# Patient Record
Sex: Female | Born: 2000 | Race: Black or African American | Hispanic: No | Marital: Single | Smoking: Never smoker
Health system: Southern US, Community
[De-identification: ages and names within clinical notes are randomized; demographics above are authoritative.]

## PROBLEM LIST (undated history)

## (undated) DIAGNOSIS — F909 Attention-deficit hyperactivity disorder, unspecified type: Secondary | ICD-10-CM

## (undated) DIAGNOSIS — F988 Other specified behavioral and emotional disorders with onset usually occurring in childhood and adolescence: Secondary | ICD-10-CM

## (undated) DIAGNOSIS — J45909 Unspecified asthma, uncomplicated: Secondary | ICD-10-CM

## (undated) DIAGNOSIS — R7309 Other abnormal glucose: Secondary | ICD-10-CM

## (undated) HISTORY — DX: Attention-deficit hyperactivity disorder, unspecified type: F90.9

## (undated) HISTORY — PX: NO PAST SURGERIES: SHX2092

## (undated) HISTORY — DX: Other abnormal glucose: R73.09

---
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
## 2022-12-08 ENCOUNTER — Ambulatory Visit
Admission: EM | Admit: 2022-12-08 | Discharge: 2022-12-08 | Disposition: A | Discharge status: Home / Self Care | Payer: Medicaid Other | Attending: Physician Assistant | Admitting: Physician Assistant

## 2022-12-08 DIAGNOSIS — K209 Esophagitis, unspecified without bleeding: Secondary | ICD-10-CM

## 2022-12-08 DIAGNOSIS — R112 Nausea with vomiting, unspecified: Secondary | ICD-10-CM | POA: Diagnosis present

## 2022-12-08 DIAGNOSIS — K047 Periapical abscess without sinus: Secondary | ICD-10-CM | POA: Diagnosis present

## 2022-12-08 DIAGNOSIS — T39395A Adverse effect of other nonsteroidal anti-inflammatory drugs [NSAID], initial encounter: Secondary | ICD-10-CM

## 2022-12-08 DIAGNOSIS — N926 Irregular menstruation, unspecified: Secondary | ICD-10-CM

## 2022-12-08 DIAGNOSIS — R6884 Jaw pain: Secondary | ICD-10-CM | POA: Diagnosis present

## 2022-12-08 DIAGNOSIS — K2960 Other gastritis without bleeding: Secondary | ICD-10-CM

## 2022-12-08 LAB — PREGNANCY, URINE: Preg Test, Ur: NEGATIVE

## 2022-12-08 MED ORDER — ONDANSETRON 4 MG PO TBDP: 4.0000 mg | ORAL_TABLET | Freq: Three times a day (TID) | ORAL | 0 refills | Status: DC | PRN

## 2022-12-08 MED ORDER — PANTOPRAZOLE SODIUM 20 MG PO TBEC: 40.0000 mg | DELAYED_RELEASE_TABLET | Freq: Every day | ORAL | 0 refills | Status: DC

## 2022-12-08 MED ORDER — AMOXICILLIN-POT CLAVULANATE 875-125 MG PO TABS: 1.0000 | ORAL_TABLET | Freq: Two times a day (BID) | ORAL | 0 refills | Status: AC

## 2022-12-08 MED ORDER — HYDROCODONE-ACETAMINOPHEN 5-325 MG PO TABS: 1.0000 | ORAL_TABLET | Freq: Four times a day (QID) | ORAL | 0 refills | Status: AC | PRN

## 2022-12-08 NOTE — ED Triage Notes (Signed)
Pt is with her [employer]  Pt c/o lump along the neck x2weeks chest pain when laying down.  Pt has been taking 800mg  ibuprofen for dental pain and it has not been helping  Pt has been having pain in her chest when laying down or sitting. Pt states that it feels like she is being stabbed.   Pt recently had dental surgery on the lower left jaw.    Pt denies vaginal odor, discharge, or urinary issues.  Pt states that her menstraul is 78 days late and asks for a pregnancy test.

## 2022-12-08 NOTE — Discharge Instructions (Addendum)
-  You have a dental infection.  I sent an antibiotic to the pharmacy. - Keep your follow-up appoint with your dentist. - May apply Orajel to the tooth.  Take the pain medication as needed.  Avoid all NSAIDs as I think that is the cause of your chest pain.  I sent an antacid to the pharmacy to help with the chest discomfort.  Increase rest and fluids.  Soft and bland foods.  Small meals.  Do not eat 2 hours before bedtime. - You should follow-up with a gynecologist for your first Pap smear and for evaluation of irregular menstrual periods.

## 2022-12-08 NOTE — ED Provider Notes (Signed)
[facility]    CSN: [id number] Arrival date & time: 12/08/22  1215      History   Chief Complaint Chief Complaint  Patient presents with   Mass    HPI Jaclyn Phillips is a 22 y.o. female presenting for 1 month history of pain of a tooth of the left lower side.  Also reports pain into her jaw and swelling under her chin.  She has a history of dental problems and reports that she actually has an appointment with a dentist in 2 days to have the tooth pulled.  Has not seen a dentist in a long time.  She says her dental pain and jaw pain have been so bad that she is taking high doses of ibuprofen.  Her family reports that she is taking 3-4 ibuprofen every 2-4 hours for her pain.  Over the past 2 days she has developed pain in the center of her chest.  She has also had nausea and a little bit of vomiting.  No abdominal pain.  Denies associated fever but again has been taking high doses of ibuprofen.  No difficulty swallowing or breathing.  She says her last LMP was probably a couple months ago.  She is sexually active.  Reports a history of irregular menstrual periods for the past couple years but has never had it evaluated.  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 No pertinent surgical history.  OB History   No obstetric history on file.      Home Medications    Prior  to Admission medications   Medication Sig Start Date End Date Taking? Authorizing Provider  albuterol (PROVENTIL) (2.5 MG/3ML) 0.083% nebulizer solution Take 3 mLs (2.5 mg total) by nebulization every 4 (four) hours as needed for wheezing or shortness of breath. 01/03/14  Yes Niel Hummer, MD  amoxicillin-clavulanate (AUGMENTIN) 875-125 MG tablet Take 1 tablet by mouth every 12 (twelve) hours for 7 days. 12/08/22 12/15/22 Yes Shirlee Latch, PA-C  cetirizine (ZYRTEC) 10 MG tablet Take 10 mg by mouth daily. 09/24/16  Yes [provider]  FOCALIN XR 15 MG 24 hr capsule Take by mouth daily. 09/24/16  Yes [provider]  HYDROcodone-acetaminophen (NORCO) 5-325 MG tablet Take 1 tablet by mouth every 6 (six) hours as needed for up to 3 days for moderate pain (pain score 4-6). 12/08/22 12/11/22 Yes Shirlee Latch, PA-C  metFORMIN (GLUCOPHAGE) 500 MG tablet Take 1 tablet (500 mg total) by mouth daily with supper. 02/21/17  Yes Casimiro Needle, MD  norethindrone-ethinyl estradiol-iron (MICROGESTIN FE,GILDESS FE,LOESTRIN FE) 1.5-30 MG-MCG tablet Take 1 tablet by mouth daily. 02/21/17  Yes Casimiro Needle, MD  ondansetron (ZOFRAN-ODT) 4 MG disintegrating tablet Take 1 tablet (4 mg total) by mouth every 8 (eight) hours as needed. 12/08/22  Yes Eusebio Friendly B, PA-C  pantoprazole (PROTONIX) 20  MG tablet Take 2 tablets (40 mg total) by mouth daily for 14 days. 12/08/22 12/22/22 Yes Shirlee Latch, PA-C    Family History Family History  Adopted: Yes  Problem Relation Age of Onset   Healthy Sister     Social History Social History   Tobacco Use   Smoking status: Never   Smokeless tobacco: Never  Vaping Use   Vaping status: Every Day  Substance Use Topics   Alcohol use: Yes   Drug use: No     Allergies   Patient has no known allergies.   Review of Systems Review of Systems  Constitutional:  Negative for fatigue and fever.  HENT:  Positive for dental problem and  facial swelling. Negative for rhinorrhea, sore throat and trouble swallowing.   Respiratory:  Negative for cough, shortness of breath and wheezing.   Cardiovascular:  Positive for chest pain.  Gastrointestinal:  Positive for nausea and vomiting. Negative for abdominal pain.  Genitourinary:  Positive for menstrual problem (irregular periods). Negative for dysuria and vaginal discharge.  Neurological:  Positive for headaches. Negative for dizziness and weakness.  Hematological:  Positive for adenopathy.     Physical Exam Triage Vital Signs ED Triage Vitals  Encounter Vitals Group     BP 12/08/22 1318 117/83     Systolic BP Percentile --      Diastolic BP Percentile --      Pulse Rate 12/08/22 1318 75     Resp --      Temp 12/08/22 1318 98.7 F (37.1 C)     Temp Source 12/08/22 1318 Oral     SpO2 12/08/22 1318 98 %     Weight 12/08/22 1314 240 lb (108.9 kg)     Height 12/08/22 1314 5\' 2"  (1.575 m)     Head Circumference --      Peak Flow --      Pain Score 12/08/22 1314 9     Pain Loc --      Pain Education --      Exclude from Growth Chart --    No data found.  Updated Vital Signs BP 117/83 (BP Location: Left Arm)   Pulse 75   Temp 98.7 F (37.1 C) (Oral)   Ht 5\' 2"  (1.575 m)   Wt 240 lb (108.9 kg)   LMP  (LMP Unknown)   SpO2 98%   BMI 43.90 kg/m     Physical Exam Vitals and nursing note reviewed.  Constitutional:      General: She is not in acute distress.    Appearance: Normal appearance. She is not ill-appearing or toxic-appearing.  HENT:     Head: Normocephalic and atraumatic.     Comments: Moderate submandibular swelling and tenderness    Nose: Nose normal.     Mouth/Throat:     Mouth: Mucous membranes are moist.     Dentition: Abnormal dentition. Dental tenderness present.     Pharynx: Oropharynx is clear.      Comments: Left third lower molar is grayish in discoloration and very tender to palpation.  Tenderness noted along the jaw.  No clear abscess  noted. Eyes:     General: No scleral icterus.       Right eye: No discharge.        Left eye: No discharge.     Conjunctiva/sclera: Conjunctivae normal.  Cardiovascular:     Rate and Rhythm: Normal rate and regular rhythm.     Heart sounds: Normal heart sounds.  Pulmonary:     Effort: Pulmonary effort is normal. No respiratory distress.     Breath sounds: Normal breath sounds.  Abdominal:     Palpations: Abdomen is soft.     Tenderness: There is no abdominal tenderness.  Musculoskeletal:     Cervical back: Neck supple.  Lymphadenopathy:     Cervical: Cervical adenopathy present.  Skin:    General: Skin is dry.  Neurological:     General: No focal deficit present.     Mental Status: She is alert. Mental status is at baseline.     Motor: No weakness.     Gait: Gait normal.  Psychiatric:        Mood and Affect: Mood normal.        Behavior: Behavior normal.        Thought Content: Thought content normal.      UC Treatments / Results  Labs (all labs ordered are listed, but only abnormal results are displayed) Labs Reviewed  PREGNANCY, URINE    EKG   Radiology No results found.  Procedures Procedures (including critical care time)  Medications Ordered in UC Medications - No data to display  Initial Impression / Assessment and Plan / UC Course  I have reviewed the triage vital signs and the nursing notes.  Pertinent labs & imaging results that were available during my care of the patient were reviewed by me and considered in my medical decision making (see chart for details).   22 year old female presents for dental pain of a tooth of the left lower side, jaw pain, swelling in the submandibular area x 1 month.  Has had chest pain centrally with nausea and vomiting for the past 2 days.  Taking high doses of ibuprofen.  Reports taking 3-4 ibuprofen every 2-4 hours for a long time due to the dental pain.  Has an appoint with dentist in 2 days for evaluation.  Vitals  are normal and stable.  She is overall well-appearing.  She says the last time she had Motrin was several hours ago and she had 800 mg.  She does have grayish discoloration of the tooth of the left lower side and the tooth is tender.  Swelling along the jaw and submandibular area.  Throat is clear.  Chest clear to auscultation.  No tenderness of chest or abdomen.  Patient likely has dental infection.  Will treat her at this time with Augmentin.  Also sent Zofran for nausea and vomiting but that is likely related to NSAID induced gastritis/esophagitis.  Advised her to discontinue use of all NSAIDs and avoid triggers for GERD.  Sent pantoprazole to pharmacy.  Sent Norco as needed for severe pain.  Advised to follow-up with dentist in a couple days.  Reviewed going to ED if fever, worsening swelling or worsening of pain.  Advise going to ED if worsening of chest pain, shortness of breath, dizziness, weakness.  Urine pregnancy test was done today since she was unsure of her last menstrual period and it was negative.  Encouraged her to follow-up with gynecologist.  She is 22 and needs to have her first Pap smear and also workup for irregular menstrual periods.  Patient is agreeable.  Will contact Medicaid to find a covered provider.   Final Clinical Impressions(s) / UC Diagnoses   Final diagnoses:  Dental infection  Jaw pain  Acute esophagitis  NSAID induced gastritis  Nausea and vomiting, unspecified vomiting type  Irregular menstrual cycle     Discharge Instructions      -  You have a dental infection.  I sent an antibiotic to the pharmacy. - Keep your follow-up appoint with your dentist. - May apply Orajel to the tooth.  Take the pain medication as needed.  Avoid all NSAIDs as I think that is the cause of your chest pain.  I sent an antacid to the pharmacy to help with the chest discomfort.  Increase rest and fluids.  Soft and bland foods.  Small meals.  Do not eat 2 hours before bedtime. - You  should follow-up with a gynecologist for your first Pap smear and for evaluation of irregular menstrual periods.     ED Prescriptions     Medication Sig Dispense Auth. Provider   amoxicillin-clavulanate (AUGMENTIN) 875-125 MG tablet Take 1 tablet by mouth every 12 (twelve) hours for 7 days. 14 tablet Eusebio Friendly B, PA-C   HYDROcodone-acetaminophen (NORCO) 5-325 MG tablet Take 1 tablet by mouth every 6 (six) hours as needed for up to 3 days for moderate pain (pain score 4-6). 12 tablet Eusebio Friendly B, PA-C   pantoprazole (PROTONIX) 20 MG tablet Take 2 tablets (40 mg total) by mouth daily for 14 days. 28 tablet Eusebio Friendly B, PA-C   ondansetron (ZOFRAN-ODT) 4 MG disintegrating tablet Take 1 tablet (4 mg total) by mouth every 8 (eight) hours as needed. 15 tablet Shirlee Latch, PA-C      I have reviewed the PDMP during this encounter.   Shirlee Latch, PA-C 12/08/22 1446

## 2023-08-02 ENCOUNTER — Ambulatory Visit

## 2023-08-02 VITALS — BP 128/77 | Ht 62.0 in | Wt 272.5 lb

## 2023-08-02 DIAGNOSIS — Z309 Encounter for contraceptive management, unspecified: Secondary | ICD-10-CM

## 2023-08-02 DIAGNOSIS — Z3201 Encounter for pregnancy test, result positive: Secondary | ICD-10-CM

## 2023-08-02 LAB — PREGNANCY, URINE: Preg Test, Ur: POSITIVE — AB

## 2023-08-02 MED ORDER — PRENATAL 27-0.8 MG PO TABS: 1.0000 | ORAL_TABLET | Freq: Every day | ORAL | Status: AC

## 2023-08-02 NOTE — Progress Notes (Signed)
 UPT positive. Plans to receive prenatal care at [facility]; encouraged to establish care ASAP. List of local providers given to patient.  The patient was dispensed prenatal vitamins #100 today. I provided counseling today regarding the medication. We discussed the medication, the side effects and when to call clinic.   Positive pregnancy packet reviewed and given to patient. Also counseled on hydration and when to seek medical attention.  Patient given the opportunity to ask questions. Questions answered.   Clare Critchley, RN

## 2023-08-08 ENCOUNTER — Telehealth: Payer: Self-pay

## 2023-08-08 ENCOUNTER — Encounter

## 2023-08-08 NOTE — Telephone Encounter (Signed)
 Call to client as DNKA for RN portion of new OB appt 08/08/23 as had found out would not get an US  today in clinic and wants to know gender of baby. Counseled on importance of prenatal care (EGA [redacted] weeks with no prenatal care to date). Also counseled on availability of MaterniT-21 testing at [facility] regarding gender identity. Counseled [facility] would order her an US  at her provider appt. Client declined to reschedule [facility] appt. States is going to go somewhere else where will get an US . Counseled may not automatically get an US  at her first OB appt at any practice. Offered to provide her with [facility], [facility] and [facility] OB phone numbers and client declined. Re-offered again to reschedule her [facility] appt at [facility] and she declined. Burnadette Lowers, RN

## 2023-08-13 ENCOUNTER — Telehealth (INDEPENDENT_AMBULATORY_CARE_PROVIDER_SITE_OTHER)

## 2023-08-13 VITALS — Ht 62.0 in | Wt 271.0 lb

## 2023-08-13 DIAGNOSIS — O3680X0 Pregnancy with inconclusive fetal viability, not applicable or unspecified: Secondary | ICD-10-CM

## 2023-08-13 DIAGNOSIS — Z3A21 21 weeks gestation of pregnancy: Secondary | ICD-10-CM

## 2023-08-13 DIAGNOSIS — Z3490 Encounter for supervision of normal pregnancy, unspecified, unspecified trimester: Secondary | ICD-10-CM | POA: Insufficient documentation

## 2023-08-13 DIAGNOSIS — Z3689 Encounter for other specified antenatal screening: Secondary | ICD-10-CM

## 2023-08-13 DIAGNOSIS — Z3482 Encounter for supervision of other normal pregnancy, second trimester: Secondary | ICD-10-CM

## 2023-08-13 NOTE — Progress Notes (Signed)
 New OB Intake  I connected with  Jaclyn Phillips on 08/13/23 at 10:15 AM EDT by MyChart Video Visit and verified that I am speaking with the correct person using two identifiers. Nurse is located at [facility] and pt is located at home.  I discussed the limitations, risks, security and privacy concerns of performing an evaluation and management service by telephone and the availability of in person appointments. I also discussed with the patient that there may be a patient responsible charge related to this service. The patient expressed understanding and agreed to proceed.  I explained I am completing New OB Intake today. We discussed her EDD of 12/22/23 that is based on LMP of 03/17/23. Pt is G2/P0010. I reviewed her allergies, medications, Medical/Surgical/OB history, and appropriate screenings. There are cats in the home in the home, No. Based on history, this is a/an pregnancy uncomplicated . Her obstetrical history is significant for late to prenatal care.  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 Pt notified to arrive at 2:45 pm.  Labs Discussed genetic screening with patient. Patient wants genetic testing to be drawn at new OB visit. Discussed possible labs to be drawn at new OB appointment.  COVID Vaccine Patient has had 3 COVID vaccines.   Social Determinants of Health Food Insecurity: expresses food  insecurity. Information given on local food banks. WIC Referral: Patient is interested in referral to [facility].  Transportation: Patient denies transportation needs. Childcare: Discussed no children allowed at ultrasound appointments.   First visit review I reviewed new OB appt with pt. I explained she will have ob bloodwork and pap smear/pelvic exam if indicated. Explained pt will be seen by Harlene Cisco, CNM at first visit; encounter routed to appropriate provider.   Jaclyn Phillips, CMA 08/13/2023  10:51 AM

## 2023-08-13 NOTE — Patient Instructions (Signed)
 Prenatal Classes offered through [facility] [email or web address]  Doula Website [email or web address]   Second Trimester of Pregnancy  The second trimester of pregnancy is from week 14 through week 27. This is months 4 through 6 of pregnancy. During the second trimester: Morning sickness is less or has stopped. You may have more energy. You may feel hungry more often. At this time, your unborn baby is growing very fast. At the end of the sixth month, the unborn baby may be up to 12 inches long and weigh about 1 pounds. You will likely start to feel the baby move between 16 and 20 weeks of pregnancy. Body changes during your second trimester Your body continues to change during this time. The changes usually go away after your baby is born. Physical changes You will gain more weight. Your belly will get bigger. You may begin to get stretch marks on your hips, belly, and breasts. Your breasts will keep growing and may hurt. You may get dark spots or blotches on your face. A dark line from your belly button to the pubic area may appear. This line is called linea nigra. Your hair may grow faster and get thicker. Health changes You may have headaches. You may have heartburn. You may pee more often. You may have swollen, bulging veins (varicose veins). You may have trouble pooping (constipation), or swollen veins in the butt that can itch or get painful (hemorrhoids). You may have back pain. This is caused by: Weight gain. Pregnancy hormones that are relaxing the joints in your pelvis. Follow these instructions at home: Medicines Talk to your health care provider if you're taking medicines. Ask if the medicines are safe to take during pregnancy. Your provider may change the medicines that you take. Do not take any medicines unless told to by your provider. Take a  prenatal vitamin that has at least 600 micrograms (mcg) of folic acid. Do not use herbal medicines, illegal drugs, or medicines that are not approved by your provider. Eating and drinking While you're pregnant your body needs extra food for your growing baby. Talk with your provider about what to eat while pregnant. Activity Most women are able to exercise during pregnancy. Exercises may need to change as your pregnancy goes on. Talk to your provider about your activities and exercise routines. Relieving pain and discomfort Wear a good, supportive bra if your breasts hurt. Rest with your legs raised if you have leg cramps or low back pain. Take warm sitz baths to soothe pain from hemorrhoids. Use hemorrhoid cream if your provider says it's okay. Do not douche. Do not use tampons or scented pads. Do not use hot tubs, steam rooms, or saunas. Safety Wear your seatbelt at all times when you're in a car. Talk to your provider if someone hits you, hurts you, or yells at you. Talk with your provider if you're feeling sad or have thoughts of hurting yourself. Lifestyle Certain things can be harmful while you're pregnant. It's best to avoid the following: Do not drink alcohol,smoke, vape, or use products with nicotine or tobacco in them. If you need help quitting, talk with your provider. Avoid cat litter boxes and soil used by cats. These things carry germs that can cause harm to your pregnancy and your baby. General instructions Keep all follow-up visits. It helps you and your unborn baby stay as healthy as possible. Write down your questions. Take them to your prenatal visits. Your provider will: Talk with you about your overall health. Give you  advice or refer you to specialists who can help with different needs, including: Prenatal education classes. Mental health and counseling. Foods and healthy eating. Ask for help if you need help with food. Where to find more information American  Pregnancy Association: americanpregnancy.org [employer]: acog.org Office on [employer] Health: TravelLesson.ca Contact a health care provider if: You have a headache that does not go away when you take medicine. You have any of these problems: You can't eat or drink. You throw up or feel like you may throw up. You have watery poop (diarrhea) for 2 days or more. You have pain when you pee or your pee smells bad. You have been sick for 2 days or more and are not getting better. Contact your provider right away if: You have any of these coming from your vagina: Abnormal discharge. Bad-smelling fluid. Bleeding. Your baby is moving less than usual. You have contractions, belly cramping, or have pain in your pelvis or lower back. You have symptoms of high blood pressure or preeclampsia. These include: A severe, throbbing headache that does not go away. Sudden or extreme swelling of your face, hands, legs, or feet. Vision problems: You see spots. You have blurry vision. Your eyes are sensitive to light. If you can't reach the provider, go to an urgent care or emergency room. Get help right away if: You faint, become confused, or can't think clearly. You have chest pain or trouble breathing. You have any kind of injury, such as from a fall or a car crash. These symptoms may be an emergency. Call 911 right away. Do not wait to see if the symptoms will go away. Do not drive yourself to the hospital. This information is not intended to replace advice given to you by your health care provider. Make sure you discuss any questions you have with your health care provider. Document Revised: 11/01/2022 Document Reviewed: 06/01/2022 Elsevier Patient Education  2024 Elsevier Inc.  Common Medications Safe in Pregnancy  Acne:      Constipation:  Benzoyl Peroxide     Colace  Clindamycin      Dulcolax Suppository  Topica Erythromycin     Fibercon  Salicylic  Acid      Metamucil         Miralax AVOID:        Senakot   Accutane    Cough:  Retin-A       Cough Drops  Tetracycline      Phenergan w/ Codeine if Rx  Minocycline      Robitussin (Plain & DM)  Antibiotics:     Crabs/Lice:  Ceclor       RID  Cephalosporins    AVOID:  E-Mycins      Kwell  Keflex  Macrobid/Macrodantin   Diarrhea:  Penicillin      Kao-Pectate  Zithromax      Imodium AD         PUSH FLUIDS AVOID:       Cipro     Fever:  Tetracycline      Tylenol (Regular or Extra  Minocycline       Strength)  Levaquin      Extra Strength-Do not          Exceed 8 tabs/24 hrs Caffeine:        200mg /day (equiv. To 1 cup of coffee or  approx. 3 12 oz sodas)         Gas: Cold/Hayfever:  Gas-X  Benadryl      Mylicon  Claritin       Phazyme  **Claritin-D        Chlor-Trimeton    Headaches:  Dimetapp      ASA-Free Excedrin  Drixoral-Non-Drowsy     Cold Compress  Mucinex (Guaifenasin)     Tylenol (Regular or Extra  Sudafed/Sudafed-12 Hour     Strength)  **Sudafed PE Pseudoephedrine   Tylenol Cold & Sinus     Vicks Vapor Rub  Zyrtec  **AVOID if Problems With Blood Pressure         Heartburn: Avoid lying down for at least 1 hour after meals  Aciphex      Maalox     Rash:  Milk of Magnesia     Benadryl    Mylanta       1% Hydrocortisone Cream  Pepcid  Pepcid Complete   Sleep Aids:  Prevacid      Ambien   Prilosec       Benadryl  Rolaids       Chamomile Tea  Tums (Limit 4/day)     Unisom         Tylenol PM         Warm milk-add vanilla or  Hemorrhoids:       Sugar for taste  Anusol/Anusol H.C.  (RX: Analapram 2.5%)  Sugar Substitutes:  Hydrocortisone OTC     Ok in moderation  Preparation H      Tucks        Vaseline lotion applied to tissue with wiping    Herpes:     Throat:  Acyclovir      Oragel  Famvir  Valtrex     Vaccines:         Flu Shot Leg Cramps:       *Gardasil  Benadryl      Hepatitis A         Hepatitis B Nasal  Spray:       Pneumovax  Saline Nasal Spray     Polio Booster         Tetanus Nausea:       Tuberculosis test or PPD  Vitamin B6 25 mg TID   AVOID:    Dramamine      *Gardasil  Emetrol       Live Poliovirus  Ginger Root 250 mg QID    MMR (measles, mumps &  High Complex Carbs @ Bedtime    rebella)  Sea Bands-Accupressure    Varicella (Chickenpox)  Unisom 1/2 tab TID     *No known complications           If received before Pain:         Known pregnancy;   Darvocet       Resume series after  Lortab        Delivery  Percocet    Yeast:   Tramadol      Femstat  Tylenol 3      Gyne-lotrimin  Ultram       Monistat  Vicodin           MISC:         All Sunscreens           Hair Coloring/highlights          Insect Repellant's          (Including DEET)         Mystic Tans

## 2023-08-20 ENCOUNTER — Ambulatory Visit (INDEPENDENT_AMBULATORY_CARE_PROVIDER_SITE_OTHER)

## 2023-08-20 DIAGNOSIS — Z3482 Encounter for supervision of other normal pregnancy, second trimester: Secondary | ICD-10-CM

## 2023-08-20 DIAGNOSIS — Z3689 Encounter for other specified antenatal screening: Secondary | ICD-10-CM

## 2023-08-20 DIAGNOSIS — O3680X0 Pregnancy with inconclusive fetal viability, not applicable or unspecified: Secondary | ICD-10-CM

## 2023-08-20 DIAGNOSIS — Z3A08 8 weeks gestation of pregnancy: Secondary | ICD-10-CM

## 2023-08-23 ENCOUNTER — Encounter: Admitting: Certified Nurse Midwife

## 2023-08-28 NOTE — Progress Notes (Signed)
 OBSTETRIC INITIAL PRENATAL VISIT  Subjective:    Jaclyn Phillips is being seen today for her first obstetrical visit.  This is not a planned pregnancy. She is a 23 y.o. G2P0010 female at [redacted]w[redacted]d gestation, Estimated Date of Delivery: 12/22/23 with Patient's last menstrual period was 03/17/2023.,  not consistent with 8 week sono. Her obstetrical history is significant for obesity. Relationship with FOB: is not involved. Patient is undecided if she intends to breast feed. Pregnancy history fully reviewed.    OB History  Gravida Para Term Preterm AB Living  2 0 0 0 1 0  SAB IAB Ectopic Multiple Live Births  1 0 0 0 0    # Outcome Date GA Lbr Len/2nd Weight Sex Type Anes PTL Lv  2 Current           1 SAB             Gynecologic History:  Patient has never had a pap.   denies history of STIs.  Contraception prior to conception: Nexplanon   Past Medical History:  Diagnosis Date   ADD (attention deficit disorder)    ADHD (attention deficit hyperactivity disorder)    Asthma    Elevated hemoglobin A1c    7.3% in 10/2016 then decreased to 5.8% in 02/2017    Family History  Adopted: Yes  Problem Relation Age of Onset   Healthy Sister     Past Surgical History:  Procedure Laterality Date   NO PAST SURGERIES      Social History   Socioeconomic History   Marital status: Single    Spouse name: Not on file   Number of children: 0   Years of education: Not on file   Highest education level: Not on file  Occupational History   Not on file  Tobacco Use   Smoking status: Never   Smokeless tobacco: Never  Vaping Use   Vaping status: Never Used  Substance and Sexual Activity   Alcohol use: Not Currently    Alcohol/week: 1.0 standard drink of alcohol    Types: 1 Shots of liquor per week    Comment: last ETOH 05/2023   Drug use: No   Sexual activity: Yes    Birth control/protection: None  Other Topics Concern   Not on file  Social History Narrative   Patient goes to  [employer] and is in 9th grade.    Social Drivers of Corporate investment banker Strain: Not on file  Food Insecurity: Food Insecurity Present (08/13/2023)   Hunger Vital Sign    Worried About Running Out of Food in the Last Year: Often true    Ran Out of Food in the Last Year: Sometimes true  Transportation Needs: No Transportation Needs (08/13/2023)   PRAPARE - Administrator, Civil Service (Medical): No    Lack of Transportation (Non-Medical): No  Physical Activity: Not on file  Stress: Not on file  Social Connections: Not on file  Intimate Partner Violence: Not At Risk (08/02/2023)   Humiliation, Afraid, Rape, and Kick questionnaire    Fear of Current or Ex-Partner: No    Emotionally Abused: No    Physically Abused: No    Sexually Abused: No    Current Outpatient Medications on File Prior to Visit  Medication Sig Dispense Refill   albuterol  (PROVENTIL ) (2.5 MG/3ML) 0.083% nebulizer solution Take 3 mLs (2.5 mg total) by nebulization every 4 (four) hours as needed for wheezing or shortness  of breath. 75 mL 1   Prenatal Vit-Fe Fumarate-FA (MULTIVITAMIN-PRENATAL) 27-0.8 MG TABS tablet Take 1 tablet by mouth daily at 12 noon.     No current facility-administered medications on file prior to visit.    No Known Allergies   Review of Systems General: Not Present- Fever, Weight Loss and Weight Gain. Skin: Not Present- Rash. HEENT: Not Present- Blurred Vision, Headache and Bleeding Gums. Respiratory: Not Present- Difficulty Breathing. Breast: Not Present- Breast Mass. Cardiovascular: Not Present- Chest Pain, Elevated Blood Pressure, Fainting / Blacking Out and Shortness of Breath. Gastrointestinal: Not Present- Abdominal Pain, Constipation, Nausea and Vomiting. Female Genitourinary: Not Present- Frequency, Painful Urination, Pelvic Pain, Vaginal Bleeding, Vaginal Discharge, Contractions, regular, Fetal Movements Decreased, Urinary Complaints and Vaginal  Fluid. Musculoskeletal: Not Present- Back Pain and Leg Cramps. Neurological: Not Present- Dizziness. Psychiatric: Not Present- Depression.     Objective:   Blood pressure 133/70, pulse 93, weight 276 lb 4.8 oz (125.3 kg), last menstrual period 03/17/2023.  Body mass index is 50.54 kg/m.  General Appearance:    Alert, cooperative, no distress, appears stated age  Head:    Normocephalic, without obvious abnormality, atraumatic  Eyes:    PERRL, conjunctiva/corneas clear, EOM's intact, both eyes  Ears:    Normal external ear canals, both ears  Nose:   Nares normal, septum midline, mucosa normal, no drainage or sinus tenderness  Throat:   Lips, mucosa, and tongue normal; teeth and gums normal  Neck:   Supple, symmetrical, trachea midline, no adenopathy; thyroid: no enlargement/tenderness/nodules; no carotid bruit or JVD  Back:     Symmetric, no curvature, ROM normal, no CVA tenderness  Lungs:     Clear to auscultation bilaterally, respirations unlabored  Chest Wall:    No tenderness or deformity   Heart:    Regular rate and rhythm, S1 and S2 normal, no murmur, rub or gallop  Breast Exam:    No tenderness, masses, or nipple abnormality  Abdomen:     Soft, non-tender, bowel sounds active all four quadrants, no masses, no organomegaly.    Genitalia:    Pelvic:external genitalia normal, vagina without lesions, discharge, or tenderness, rectovaginal septum  normal. Cervix normal in appearance, no cervical motion tenderness, no adnexal masses or tenderness.  Pregnancy positive findings: uterine enlargement: 10 wk size, nontender.   Rectal:    Normal external sphincter.  No hemorrhoids appreciated. Internal exam not done.   Extremities:   Extremities normal, atraumatic, no cyanosis or edema  Pulses:   2+ and symmetric all extremities  Skin:   Skin color, texture, turgor normal, no rashes or lesions  Lymph nodes:   Cervical, supraclavicular, and axillary nodes normal  Neurologic:   CNII-XII intact,  normal strength, sensation and reflexes throughout     Assessment:   1. Encounter for supervision of normal first pregnancy in first trimester   2. [redacted] weeks gestation of pregnancy   3. Screening for diabetes mellitus   4. Screen for STD (sexually transmitted disease)   5. Genetic screening   6. Obesity affecting pregnancy in first trimester, unspecified obesity type   7. Cervical cancer screening   8. Screening for thyroid disorder     Plan:   Supervision of high risk pregnancy  - Initial labs reviewed. - Prenatal vitamins encouraged. - Problem list reviewed and updated. - New OB counseling:  The patient has been given an overview regarding routine prenatal care.  Recommendations regarding diet, weight gain, and exercise in pregnancy were given. - Prenatal testing,  optional genetic testing, and ultrasound use in pregnancy were reviewed.  Traditional genetic screening vs cell-fee DNA genetic screening discussed, including risks and benefits. Testing ordered. - Benefits of Breast Feeding were discussed. The patient is encouraged to consider nursing her baby post partum.  1. Encounter for supervision of normal first pregnancy in first trimester (Primary)   2. [redacted] weeks gestation of pregnancy -NOB labs collected  3. Screening for diabetes mellitus   4. Screen for STD (sexually transmitted disease)   5. Genetic screening -Ordered and discussed with patient  6. Obesity affecting pregnancy in first trimester, unspecified obesity type -Obesity labs collected -Reviewed expectations for pregnancy care -Reviewed weight gain expectations    Follow up in 4 weeks.   Damien Vergene Marland CNM [facility]

## 2023-08-30 ENCOUNTER — Other Ambulatory Visit (HOSPITAL_COMMUNITY)
Admission: RE | Admit: 2023-08-30 | Discharge: 2023-08-30 | Disposition: A | Discharge status: Home / Self Care | Source: Ambulatory Visit | Attending: Certified Nurse Midwife | Admitting: Certified Nurse Midwife

## 2023-08-30 ENCOUNTER — Encounter: Payer: Self-pay | Admitting: Certified Nurse Midwife

## 2023-08-30 ENCOUNTER — Ambulatory Visit (INDEPENDENT_AMBULATORY_CARE_PROVIDER_SITE_OTHER): Admitting: Certified Nurse Midwife

## 2023-08-30 VITALS — BP 133/70 | HR 93 | Wt 276.3 lb

## 2023-08-30 DIAGNOSIS — Z1379 Encounter for other screening for genetic and chromosomal anomalies: Secondary | ICD-10-CM

## 2023-08-30 DIAGNOSIS — Z113 Encounter for screening for infections with a predominantly sexual mode of transmission: Secondary | ICD-10-CM | POA: Insufficient documentation

## 2023-08-30 DIAGNOSIS — Z3A09 9 weeks gestation of pregnancy: Secondary | ICD-10-CM

## 2023-08-30 DIAGNOSIS — Z124 Encounter for screening for malignant neoplasm of cervix: Secondary | ICD-10-CM | POA: Insufficient documentation

## 2023-08-30 DIAGNOSIS — O99211 Obesity complicating pregnancy, first trimester: Secondary | ICD-10-CM

## 2023-08-30 DIAGNOSIS — Z131 Encounter for screening for diabetes mellitus: Secondary | ICD-10-CM

## 2023-08-30 DIAGNOSIS — Z3401 Encounter for supervision of normal first pregnancy, first trimester: Secondary | ICD-10-CM

## 2023-08-30 DIAGNOSIS — Z1329 Encounter for screening for other suspected endocrine disorder: Secondary | ICD-10-CM

## 2023-09-03 LAB — CERVICOVAGINAL ANCILLARY ONLY
Bacterial Vaginitis (gardnerella): POSITIVE — AB
Comment: NEGATIVE

## 2023-09-04 ENCOUNTER — Other Ambulatory Visit: Payer: Self-pay | Admitting: Certified Nurse Midwife

## 2023-09-04 ENCOUNTER — Ambulatory Visit: Payer: Self-pay | Admitting: Certified Nurse Midwife

## 2023-09-04 MED ORDER — METRONIDAZOLE 500 MG PO TABS: 500.0000 mg | ORAL_TABLET | Freq: Two times a day (BID) | ORAL | 0 refills | Status: AC

## 2023-09-05 LAB — CYTOLOGY - PAP: Diagnosis: NEGATIVE

## 2023-09-06 ENCOUNTER — Emergency Department
Admission: EM | Admit: 2023-09-06 | Discharge: 2023-09-06 | Disposition: A | Discharge status: Home / Self Care | Attending: Emergency Medicine | Admitting: Emergency Medicine

## 2023-09-06 ENCOUNTER — Other Ambulatory Visit: Payer: Self-pay

## 2023-09-06 ENCOUNTER — Emergency Department

## 2023-09-06 DIAGNOSIS — J45909 Unspecified asthma, uncomplicated: Secondary | ICD-10-CM | POA: Insufficient documentation

## 2023-09-06 DIAGNOSIS — O209 Hemorrhage in early pregnancy, unspecified: Secondary | ICD-10-CM | POA: Diagnosis present

## 2023-09-06 DIAGNOSIS — O021 Missed abortion: Secondary | ICD-10-CM | POA: Insufficient documentation

## 2023-09-06 DIAGNOSIS — Z3A10 10 weeks gestation of pregnancy: Secondary | ICD-10-CM | POA: Insufficient documentation

## 2023-09-06 DIAGNOSIS — D509 Iron deficiency anemia, unspecified: Secondary | ICD-10-CM | POA: Diagnosis not present

## 2023-09-06 DIAGNOSIS — O4690 Antepartum hemorrhage, unspecified, unspecified trimester: Secondary | ICD-10-CM

## 2023-09-06 LAB — BASIC METABOLIC PANEL WITH GFR
Anion gap: 11 (ref 5–15)
BUN: 9 mg/dL (ref 6–20)
CO2: 23 mmol/L (ref 22–32)
Calcium: 9.2 mg/dL (ref 8.9–10.3)
Chloride: 105 mmol/L (ref 98–111)
Creatinine, Ser: 0.74 mg/dL (ref 0.44–1.00)
GFR, Estimated: >60 mL/min (ref >60)
Glucose, Bld: 157 mg/dL — ABNORMAL HIGH (ref 70–99)
Potassium: 3.9 mmol/L (ref 3.5–5.1)
Sodium: 139 mmol/L (ref 135–145)

## 2023-09-06 LAB — CBC WITH DIFFERENTIAL/PLATELET
Abs Immature Granulocytes: 0.02 K/uL (ref 0.00–0.07)
Basophils Absolute: 0 K/uL (ref 0.0–0.1)
Basophils Relative: 1 %
Eosinophils Absolute: 0.1 K/uL (ref 0.0–0.5)
Eosinophils Relative: 2 %
HCT: 33.8 % — ABNORMAL LOW (ref 36.0–46.0)
Hemoglobin: 12 g/dL (ref 12.0–15.0)
Immature Granulocytes: 0 %
Lymphocytes Relative: 45 %
Lymphs Abs: 2.7 K/uL (ref 0.7–4.0)
MCH: 25.9 pg — ABNORMAL LOW (ref 26.0–34.0)
MCHC: 35.5 g/dL (ref 30.0–36.0)
MCV: 72.8 fL — ABNORMAL LOW (ref 80.0–100.0)
Monocytes Absolute: 0.6 K/uL (ref 0.1–1.0)
Monocytes Relative: 9 %
Neutro Abs: 2.7 K/uL (ref 1.7–7.7)
Neutrophils Relative %: 43 %
Platelets: 243 K/uL (ref 150–400)
RBC: 4.64 MIL/uL (ref 3.87–5.11)
RDW: 14 % (ref 11.5–15.5)
WBC: 6.2 K/uL (ref 4.0–10.5)
nRBC: 0 % (ref 0.0–0.2)

## 2023-09-06 LAB — HCG, QUANTITATIVE, PREGNANCY: hCG, Beta Chain, Quant, S: 20954 m[IU]/mL — ABNORMAL HIGH (ref <5)

## 2023-09-06 LAB — ABO/RH: ABO/RH(D): A POS

## 2023-09-06 NOTE — ED Notes (Signed)
 See triage note  Presents with some vaginal bleeding   States she noticed this bleeding this am w/o pain She is approx [redacted] weeks pregnant

## 2023-09-06 NOTE — Discharge Instructions (Signed)
 Please call and schedule follow-up appointment with your OB/GYN.  You will need to have your pregnancy hormone level checked and a repeat ultrasound.  If you develop heavy vaginal bleeding that soaks more than 1 pad per hour, please return to the emergency department if you are unable to see your OB/GYN.

## 2023-09-06 NOTE — ED Provider Notes (Signed)
 [name] Provider Note    Event Date/Time   First MD Initiated Contact with Patient 09/06/23 [phone]     (approximate)   History   Vaginal Bleeding   HPI  Jaclyn Phillips is a 23 y.o. female  with history of asthma and as listed in EMR presents to the emergency department for treatment and evaluation of vaginal bleeding and passage of small clot this morning. She is about [redacted] weeks pregnant. No abdominal pain or cramping. G1P0A0.   Physical Exam    Vitals:   09/06/23 0656  BP: (!) 127/100  Pulse: 93  Resp: 18  Temp: 98.5 F (36.9 C)  SpO2: 100%    General: Awake, no distress.  CV:  Good peripheral perfusion.  Resp:  Normal effort.  Abd:  No distention. Soft. Nontender Other:     ED Results / Procedures / Treatments   Labs (all labs ordered are listed, but only abnormal results are displayed)  Labs Reviewed  CBC WITH DIFFERENTIAL/PLATELET - Abnormal; Notable for the following components:      Result Value   HCT 33.8 (*)    MCV 72.8 (*)    MCH 25.9 (*)    All other components within normal limits  BASIC METABOLIC PANEL WITH GFR - Abnormal; Notable for the following components:   Glucose, Bld 157 (*)    All other components within normal limits  HCG, QUANTITATIVE, PREGNANCY - Abnormal; Notable for the following components:   hCG, Beta Chain, Quant, S 20,954 (*)    All other components within normal limits  ABO/RH     EKG  Not indicated.   RADIOLOGY  Image and radiology report reviewed and interpreted by me. Radiology report consistent with the same.  Ultrasound shows intrauterine gestational sac with fetal pole.  Yolk sac not identified.  Fetal cardiac activity not identified although appears to have been present on the images from July 8 performed at the obstetrician's office.  Crown-rump length is currently measured at 22 mm but difficult to determine the exact length based on the submitted images.  Findings concerning for  fetal demise.  Correlation with beta-hCG and short-term follow-up with ultrasound recommended.  PROCEDURES:  Critical Care performed: No  Procedures   MEDICATIONS ORDERED IN ED:  Medications - No data to display   IMPRESSION / MDM / ASSESSMENT AND PLAN / ED COURSE   I have reviewed the triage note and vital signs. Vital signs    Differential diagnosis includes, but is not limited to, vaginal bleeding in pregnancy, miscarriage, subchorionic hemorrhage.  Patient's presentation is most consistent with acute presentation with potential threat to life or bodily function.  23 year old female presents to the emergency department for treatment and evaluation after vaginal bleeding this morning and passing a clot. This is her first pregnancy. She tells me she is not currently bleeding. Labs drawn in triage are pending. Will add ABO/RH and get OB ultrasound. Patient agreeable to the plan.  Clinical Course as of 09/06/23 1051  Fri Sep 06, 2023  0801 Lab studies are reassuring.  Mild microcytic hyperchromic anemia but otherwise normal CBC.  BMP shows a random, nonfasting glucose of 157.  Beta hCG is 20,954.  She has a positive.  OB ultrasound ordered. [CT]  1049 Ultrasound concerning for missed abortion.  Findings discussed with the patient and her mom.  Patient obviously upset and crying.  Reassurance provided.  Mom is aware that she will need to schedule a follow-up  appointment with the obstetrician for repeat labs and ultrasound.  She was instructed to return to the emergency department if unable to see the OB/GYN if she has heavy vaginal bleeding that soaks more than 1 pad per hour. [CT]    Clinical Course User Index [CT] Chanler Schreiter B, FNP     FINAL CLINICAL IMPRESSION(S) / ED DIAGNOSES   Final diagnoses:  Vaginal bleeding in pregnancy  Missed abortion     Rx / DC Orders   ED Discharge Orders     None        Note:  This document was prepared using Dragon voice  recognition software and may include unintentional dictation errors.   Herlinda Kirk NOVAK, FNP 09/06/23 1051    Viviann Pastor, MD 09/06/23 [phone]

## 2023-09-06 NOTE — ED Triage Notes (Signed)
 Pt reports she is aprox [redacted] weeks pregnant and began to have vaginal bleeding this morning, pt denies abd pain or cramping. Pt is seen at [facility] for care.

## 2023-09-08 ENCOUNTER — Emergency Department
Admission: EM | Admit: 2023-09-08 | Discharge: 2023-09-08 | Disposition: A | Discharge status: Home / Self Care | Attending: Emergency Medicine | Admitting: Emergency Medicine

## 2023-09-08 ENCOUNTER — Emergency Department

## 2023-09-08 ENCOUNTER — Other Ambulatory Visit: Payer: Self-pay

## 2023-09-08 DIAGNOSIS — J45909 Unspecified asthma, uncomplicated: Secondary | ICD-10-CM | POA: Diagnosis not present

## 2023-09-08 DIAGNOSIS — O26891 Other specified pregnancy related conditions, first trimester: Secondary | ICD-10-CM | POA: Diagnosis present

## 2023-09-08 DIAGNOSIS — O039 Complete or unspecified spontaneous abortion without complication: Secondary | ICD-10-CM | POA: Insufficient documentation

## 2023-09-08 LAB — COMPREHENSIVE METABOLIC PANEL WITH GFR
ALT: 15 IU/L (ref 0–32)
ALT: 16 U/L (ref 0–44)
AST: 28 IU/L (ref 0–40)
AST: 19 U/L (ref 15–41)
Albumin: 4.2 g/dL (ref 4.0–5.0)
Albumin: 3.8 g/dL (ref 3.5–5.0)
Alkaline Phosphatase: 57 IU/L (ref 44–121)
Alkaline Phosphatase: 43 U/L (ref 38–126)
Anion gap: 9 (ref 5–15)
BUN/Creatinine Ratio: 10 (ref 9–23)
BUN: 7 mg/dL (ref 6–20)
BUN: 10 mg/dL (ref 6–20)
Bilirubin Total: 0.2 mg/dL (ref 0.0–1.2)
CO2: 20 mmol/L (ref 20–29)
CO2: 21 mmol/L — ABNORMAL LOW (ref 22–32)
Calcium: 9.8 mg/dL (ref 8.7–10.2)
Calcium: 9.4 mg/dL (ref 8.9–10.3)
Chloride: 100 mmol/L (ref 96–106)
Chloride: 107 mmol/L (ref 98–111)
Creatinine, Ser: 0.69 mg/dL (ref 0.57–1.00)
Creatinine, Ser: 0.74 mg/dL (ref 0.44–1.00)
GFR, Estimated: >60 mL/min (ref >60)
Globulin, Total: 2.7 g/dL (ref 1.5–4.5)
Glucose, Bld: 146 mg/dL — ABNORMAL HIGH (ref 70–99)
Glucose: 211 mg/dL — ABNORMAL HIGH (ref 70–99)
Potassium: 5.6 mmol/L — ABNORMAL HIGH (ref 3.5–5.2)
Potassium: 3.9 mmol/L (ref 3.5–5.1)
Sodium: 136 mmol/L (ref 134–144)
Sodium: 137 mmol/L (ref 135–145)
Total Bilirubin: 0.6 mg/dL (ref 0.0–1.2)
Total Protein: 6.9 g/dL (ref 6.0–8.5)
Total Protein: 7.8 g/dL (ref 6.5–8.1)
eGFR: 127 mL/min/1.73 (ref >59)

## 2023-09-08 LAB — CBC WITH DIFFERENTIAL/PLATELET
Abs Immature Granulocytes: 0.03 K/uL (ref 0.00–0.07)
Basophils Absolute: 0 K/uL (ref 0.0–0.1)
Basophils Relative: 0 %
Eosinophils Absolute: 0.1 K/uL (ref 0.0–0.5)
Eosinophils Relative: 2 %
HCT: 33.9 % — ABNORMAL LOW (ref 36.0–46.0)
Hemoglobin: 12.1 g/dL (ref 12.0–15.0)
Immature Granulocytes: 0 %
Lymphocytes Relative: 32 %
Lymphs Abs: 2.8 K/uL (ref 0.7–4.0)
MCH: 25.4 pg — ABNORMAL LOW (ref 26.0–34.0)
MCHC: 35.7 g/dL (ref 30.0–36.0)
MCV: 71.2 fL — ABNORMAL LOW (ref 80.0–100.0)
Monocytes Absolute: 0.8 K/uL (ref 0.1–1.0)
Monocytes Relative: 9 %
Neutro Abs: 5.1 K/uL (ref 1.7–7.7)
Neutrophils Relative %: 57 %
Platelets: 253 K/uL (ref 150–400)
RBC: 4.76 MIL/uL (ref 3.87–5.11)
RDW: 13.8 % (ref 11.5–15.5)
WBC: 8.9 K/uL (ref 4.0–10.5)
nRBC: 0 % (ref 0.0–0.2)

## 2023-09-08 LAB — MONITOR DRUG PROFILE 14(MW)
Amphetamine Scrn, Ur: NEGATIVE ng/mL
BARBITURATE SCREEN URINE: NEGATIVE ng/mL
BENZODIAZEPINE SCREEN, URINE: NEGATIVE ng/mL
Buprenorphine, Urine: NEGATIVE ng/mL
CANNABINOIDS UR QL SCN: NEGATIVE ng/mL
Cocaine (Metab) Scrn, Ur: NEGATIVE ng/mL
Creatinine(Crt), U: 182.1 mg/dL (ref 20.0–300.0)
Fentanyl, Urine: NEGATIVE pg/mL
Meperidine Screen, Urine: NEGATIVE ng/mL
Methadone Screen, Urine: NEGATIVE ng/mL
OXYCODONE+OXYMORPHONE UR QL SCN: NEGATIVE ng/mL
Opiate Scrn, Ur: NEGATIVE ng/mL
Ph of Urine: 5.6 (ref 4.5–8.9)
Phencyclidine Qn, Ur: NEGATIVE ng/mL
Propoxyphene Scrn, Ur: NEGATIVE ng/mL
SPECIFIC GRAVITY: 1.027
Tramadol Screen, Urine: NEGATIVE ng/mL

## 2023-09-08 LAB — CBC/D/PLT+RPR+RH+ABO+RUBIGG...
Antibody Screen: NEGATIVE
Basophils Absolute: 0 x10E3/uL (ref 0.0–0.2)
Basos: 1 %
EOS (ABSOLUTE): 0.2 x10E3/uL (ref 0.0–0.4)
Eos: 3 %
HCV Ab: NONREACTIVE
HIV Screen 4th Generation wRfx: NONREACTIVE
Hematocrit: 37.3 % (ref 34.0–46.6)
Hemoglobin: 11.8 g/dL (ref 11.1–15.9)
Hepatitis B Surface Ag: NEGATIVE
Immature Grans (Abs): 0 x10E3/uL (ref 0.0–0.1)
Immature Granulocytes: 0 %
Lymphocytes Absolute: 2.4 x10E3/uL (ref 0.7–3.1)
Lymphs: 38 %
MCH: 24.8 pg — ABNORMAL LOW (ref 26.6–33.0)
MCHC: 31.6 g/dL (ref 31.5–35.7)
MCV: 79 fL (ref 79–97)
Monocytes Absolute: 0.4 x10E3/uL (ref 0.1–0.9)
Monocytes: 6 %
Neutrophils Absolute: 3.4 x10E3/uL (ref 1.4–7.0)
Neutrophils: 52 %
Platelets: 261 x10E3/uL (ref 150–450)
RBC: 4.75 x10E6/uL (ref 3.77–5.28)
RDW: 14.4 % (ref 11.7–15.4)
RPR Ser Ql: NONREACTIVE
Rh Factor: POSITIVE
Rubella Antibodies, IGG: <0.9 {index} — ABNORMAL LOW (ref >0.99)
Varicella zoster IgG: NONREACTIVE
WBC: 6.5 x10E3/uL (ref 3.4–10.8)

## 2023-09-08 LAB — HCV INTERPRETATION

## 2023-09-08 LAB — MATERNIT 21 PLUS CORE, BLOOD
Fetal Fraction: 5
Result (T21): NEGATIVE
Trisomy 13 (Patau syndrome): NEGATIVE
Trisomy 18 (Edwards syndrome): NEGATIVE
Trisomy 21 (Down syndrome): NEGATIVE

## 2023-09-08 LAB — URINALYSIS, ROUTINE W REFLEX MICROSCOPIC
Bilirubin, UA: NEGATIVE
Ketones, UA: NEGATIVE
Leukocytes,UA: NEGATIVE
Nitrite, UA: NEGATIVE
Protein,UA: NEGATIVE
Specific Gravity, UA: >=1.03 — AB (ref 1.005–1.030)
Urobilinogen, Ur: 1 mg/dL (ref 0.2–1.0)
pH, UA: 6 (ref 5.0–7.5)

## 2023-09-08 LAB — MICROSCOPIC EXAMINATION: Casts: NONE SEEN /LPF

## 2023-09-08 LAB — URINE CULTURE, OB REFLEX

## 2023-09-08 LAB — TSH+FREE T4
Free T4: 1.43 ng/dL (ref 0.82–1.77)
TSH: 2.45 u[IU]/mL (ref 0.450–4.500)

## 2023-09-08 LAB — CULTURE, OB URINE

## 2023-09-08 LAB — HCG, QUANTITATIVE, PREGNANCY: hCG, Beta Chain, Quant, S: 15094 m[IU]/mL — ABNORMAL HIGH (ref <5)

## 2023-09-08 LAB — PROTEIN / CREATININE RATIO, URINE
Creatinine, Urine: 179.8 mg/dL
Protein, Ur: 11.6 mg/dL
Protein/Creat Ratio: 65 mg/g{creat} (ref 0–200)

## 2023-09-08 LAB — NICOTINE SCREEN, URINE: Cotinine Ql Scrn, Ur: NEGATIVE ng/mL

## 2023-09-08 LAB — HEMOGLOBIN A1C
Est. average glucose Bld gHb Est-mCnc: 183 mg/dL
Hgb A1c MFr Bld: 8 % — ABNORMAL HIGH (ref 4.8–5.6)

## 2023-09-08 MED ORDER — OXYCODONE HCL 5 MG PO TABS
5.0000 mg | ORAL_TABLET | Freq: Once | ORAL | Status: AC
  Administered 2023-09-08: 5 mg via ORAL
  Filled 2023-09-08: qty 1

## 2023-09-08 MED ORDER — ACETAMINOPHEN 325 MG PO TABS
650.0000 mg | ORAL_TABLET | Freq: Once | ORAL | Status: AC
  Administered 2023-09-08: 650 mg via ORAL
  Filled 2023-09-08 (×2): qty 2

## 2023-09-08 MED ORDER — ONDANSETRON 4 MG PO TBDP
4.0000 mg | ORAL_TABLET | Freq: Once | ORAL | Status: AC
  Administered 2023-09-08: 4 mg via ORAL
  Filled 2023-09-08: qty 1

## 2023-09-08 MED ORDER — ONDANSETRON HCL 4 MG PO TABS: 4.0000 mg | ORAL_TABLET | Freq: Three times a day (TID) | ORAL | 0 refills | Status: AC | PRN

## 2023-09-08 MED ORDER — OXYCODONE HCL 5 MG PO TABS: 5.0000 mg | ORAL_TABLET | Freq: Three times a day (TID) | ORAL | 0 refills | Status: AC | PRN

## 2023-09-08 NOTE — ED Triage Notes (Signed)
 Patient brought in via [facility] from home with complaints of vaginal bleeding, cramping, vomiting. Patient is approx [redacted] weeks pregnant. States she was seen on Friday and told they could not find cardiac activity. Bleeding and cramping have gotten worse since Friday.

## 2023-09-08 NOTE — Discharge Instructions (Addendum)
 Follow-up with your gynecologist on Monday as planned.  You are having a miscarriage that is ongoing as expected with ongoing bleeding and crampy pain.  Continue to take Tylenol  650 mg every 6 hours for pain.  For severe pain you can take oxycodone  5 mg every 6 hours.  This medication can make you sleepy so do not drive or operate machinery after taking this medication, and do not take more than prescribed.   For nausea take Zofran  as prescribed.

## 2023-09-08 NOTE — ED Provider Notes (Signed)
 [facility] Provider Note    Event Date/Time   First MD Initiated Contact with Patient 09/08/23 0345     (approximate)   History   Abdominal Pain   HPI  Jaclyn Phillips is a 23 y.o. female   Past medical history of asthma who presents with ongoing vaginal bleeding and cramping pain in her pelvis.  She is approximately [redacted] weeks pregnant and was evaluated recently in the emergency department for suspected miscarriage as her ultrasound was deemed concerning for missed abortion at that time, she returns today due to ongoing vaginal bleeding and cramping abdominal pain lower.  The cramping and bleeding started on Friday, approximately 3 days ago.  She was pregnant 1 time before and had a miscarriage at around 8 weeks.  She denies vaginal discharge other than bleeding and blood clots, denies urinary symptoms, has no other acute medical complaints.  The abdominal cramping pain is bothering her quite a bit.  Independent Historian contributed to assessment above: Mother gives collateral information as above  External Medical Documents Reviewed: Recent hospital notes including ultrasound at that time, initial gynecology notes for first obstetrical visit on 08/30/2023      Physical Exam   Triage Vital Signs: ED Triage Vitals  Encounter Vitals Group     BP 09/08/23 0314 134/89     Girls Systolic BP Percentile --      Girls Diastolic BP Percentile --      Boys Systolic BP Percentile --      Boys Diastolic BP Percentile --      Pulse Rate 09/08/23 0314 91     Resp 09/08/23 0314 18     Temp 09/08/23 0314 98.2 F (36.8 C)     Temp Source 09/08/23 0314 Oral     SpO2 09/08/23 0306 100 %     Weight 09/08/23 0315 271 lb (122.9 kg)     Height 09/08/23 0315 5' 2 (1.575 m)     Head Circumference --      Peak Flow --      Pain Score --      Pain Loc --      Pain Education --      Exclude from Growth Chart --     Most recent vital signs: Vitals:    09/08/23 0306 09/08/23 0314  BP:  134/89  Pulse:  91  Resp:  18  Temp:  98.2 F (36.8 C)  SpO2: 100% 100%    General: Awake, no distress.  CV:  Good peripheral perfusion.  Resp:  Normal effort.  Abd:  No distention.  Other:  Normal vital signs slightly high blood pressure 134/89, with a overall benign abdominal exam with no significant focal tenderness on my palpation but crampy abdominal pain in the central pelvic area.  External exam shows no obvious hemorrhaging.   ED Results / Procedures / Treatments   Labs (all labs ordered are listed, but only abnormal results are displayed) Labs Reviewed  HCG, QUANTITATIVE, PREGNANCY - Abnormal; Notable for the following components:      Result Value   hCG, Beta Chain, Quant, S 15,094 (*)    All other components within normal limits  COMPREHENSIVE METABOLIC PANEL WITH GFR - Abnormal; Notable for the following components:   CO2 21 (*)    Glucose, Bld 146 (*)    All other components within normal limits  CBC WITH DIFFERENTIAL/PLATELET - Abnormal; Notable for the following components:   HCT 33.9 (*)  MCV 71.2 (*)    MCH 25.4 (*)    All other components within normal limits     I ordered and reviewed the above labs they are notable for H&H similar to prior.  RADIOLOGY I independently reviewed and interpreted pelvic ultrasound and see intrauterine gestational sac I also reviewed radiologist's formal read.   PROCEDURES:  Critical Care performed: No  Procedures   MEDICATIONS ORDERED IN ED: Medications  oxyCODONE  (Oxy IR/ROXICODONE ) immediate release tablet 5 mg (has no administration in time range)  acetaminophen  (TYLENOL ) tablet 650 mg (650 mg Oral Given 09/08/23 0440)  ondansetron  (ZOFRAN -ODT) disintegrating tablet 4 mg (4 mg Oral Given 09/08/23 0440)    External physician / consultants:  I spoke with Rea Cookey of gynecology for pain management and regarding care plan for this patient.   IMPRESSION / MDM / ASSESSMENT  AND PLAN / ED COURSE  I reviewed the triage vital signs and the nursing notes.                                Patient's presentation is most consistent with acute presentation with potential threat to life or bodily function.  Differential diagnosis includes, but is not limited to, missed abortion, retained products, acute blood loss anemia, considered but less likely other intra-abdominal pathology like appendicitis diverticulitis cholecystitis obstruction uti  The patient is on the cardiac monitor to evaluate for evidence of arrhythmia and/or significant heart rate changes.  MDM:    Patient with ongoing symptoms of cramping and bleeding since affected ongoing miscarriage.  With ongoing bleeding I think it prudent to check an H&H to check for amount of blood loss and need for transfusion.  Rh factor is positive from previous check, defer RhoGAM.  Will check a repeat transvaginal ultrasound discussed with obstetrics/gynecology as needed for further medical / surgical management options vs ongoing monitoring and expectant management.  I doubt other intra-abdominal pathologies appendicitis cholecystitis obstruction diverticulitis given an overall benign and reassuring abdominal exam no leukocytosis   Progression of miscarriage on ultrasound, decreasing hCG, 3 days into initial symptoms on the stable patient I doubt life-threatening emergency at this time.  Pain management discussed with on-call gynecology consultant Jinnie Cookey who recommends oxycodone  for severe pain, first dose given Emergency Department prescription for the same patient follow-up on Monday as scheduled.      FINAL CLINICAL IMPRESSION(S) / ED DIAGNOSES   Final diagnoses:  Miscarriage at 8 to [redacted] weeks gestation     Rx / DC Orders   ED Discharge Orders          Ordered    ondansetron  (ZOFRAN ) 4 MG tablet  Every 8 hours PRN        09/08/23 0605    oxyCODONE  (ROXICODONE ) 5 MG immediate release tablet  Every 8  hours PRN        09/08/23 0605             Note:  This document was prepared using Dragon voice recognition software and may include unintentional dictation errors.    Cyrena Mylar, MD 09/08/23 [phone]

## 2023-09-09 ENCOUNTER — Other Ambulatory Visit

## 2023-09-09 ENCOUNTER — Telehealth: Payer: Self-pay

## 2023-09-09 NOTE — Telephone Encounter (Signed)
Voice mail not set up ?Sent my chart message.  ?

## 2023-09-11 NOTE — Telephone Encounter (Signed)
Voice mail not set up ?Sent my chart message.  ?

## 2023-09-14 ENCOUNTER — Encounter: Payer: Self-pay | Admitting: Licensed Practical Nurse

## 2023-09-27 ENCOUNTER — Encounter: Admitting: Licensed Practical Nurse
